# Patient Record
Sex: Male | Born: 1975 | Race: White | Hispanic: No | Marital: Married | Smoking: Current some day smoker
Health system: Southern US, Community
[De-identification: ages and names within clinical notes are randomized; demographics above are authoritative.]

## PROBLEM LIST (undated history)

## (undated) DIAGNOSIS — Z720 Tobacco use: Secondary | ICD-10-CM

## (undated) DIAGNOSIS — K219 Gastro-esophageal reflux disease without esophagitis: Secondary | ICD-10-CM

## (undated) DIAGNOSIS — R079 Chest pain, unspecified: Secondary | ICD-10-CM

## (undated) HISTORY — DX: Chest pain, unspecified: R07.9

## (undated) HISTORY — DX: Gastro-esophageal reflux disease without esophagitis: K21.9

## (undated) HISTORY — PX: HERNIA REPAIR: SHX51

## (undated) HISTORY — PX: OTHER SURGICAL HISTORY: SHX169

## (undated) HISTORY — DX: Tobacco use: Z72.0

---

## 2013-03-26 ENCOUNTER — Emergency Department (HOSPITAL_COMMUNITY)
Admission: EM | Admit: 2013-03-26 | Discharge: 2013-03-26 | Disposition: A | Discharge status: Home / Self Care | Payer: BC Managed Care – PPO | Attending: Emergency Medicine | Admitting: Emergency Medicine

## 2013-03-26 ENCOUNTER — Emergency Department (HOSPITAL_COMMUNITY): Payer: BC Managed Care – PPO

## 2013-03-26 ENCOUNTER — Encounter (HOSPITAL_COMMUNITY): Payer: Self-pay | Admitting: Emergency Medicine

## 2013-03-26 DIAGNOSIS — K219 Gastro-esophageal reflux disease without esophagitis: Secondary | ICD-10-CM | POA: Insufficient documentation

## 2013-03-26 DIAGNOSIS — I319 Disease of pericardium, unspecified: Secondary | ICD-10-CM | POA: Insufficient documentation

## 2013-03-26 DIAGNOSIS — F17200 Nicotine dependence, unspecified, uncomplicated: Secondary | ICD-10-CM | POA: Insufficient documentation

## 2013-03-26 LAB — CBC
HEMATOCRIT: 45.8 % (ref 39.0–52.0)
Hemoglobin: 15.9 g/dL (ref 13.0–17.0)
MCH: 30.6 pg (ref 26.0–34.0)
MCHC: 34.7 g/dL (ref 30.0–36.0)
MCV: 88.2 fL (ref 78.0–100.0)
PLATELETS: 276 10*3/uL (ref 150–400)
RBC: 5.19 MIL/uL (ref 4.22–5.81)
RDW: 14 % (ref 11.5–15.5)
WBC: 15.1 10*3/uL — AB (ref 4.0–10.5)

## 2013-03-26 LAB — I-STAT CHEM 8, ED
BUN: 15 mg/dL (ref 6–23)
Calcium, Ion: 1.22 mmol/L (ref 1.12–1.23)
Chloride: 101 mEq/L (ref 96–112)
Creatinine, Ser: 1.3 mg/dL (ref 0.50–1.35)
GLUCOSE: 105 mg/dL — AB (ref 70–99)
HCT: 48 % (ref 39.0–52.0)
HEMOGLOBIN: 16.3 g/dL (ref 13.0–17.0)
Potassium: 3.7 mEq/L (ref 3.7–5.3)
Sodium: 140 mEq/L (ref 137–147)
TCO2: 27 mmol/L (ref 0–100)

## 2013-03-26 LAB — I-STAT TROPONIN, ED
Troponin i, poc: 0 ng/mL (ref 0.00–0.08)
Troponin i, poc: 0 ng/mL (ref 0.00–0.08)

## 2013-03-26 MED ORDER — NAPROXEN 500 MG PO TABS: 500.0000 mg | ORAL_TABLET | Freq: Two times a day (BID) | ORAL | Status: DC

## 2013-03-26 MED ORDER — GI COCKTAIL ~~LOC~~
30.0000 mL | Freq: Once | ORAL | Status: AC
  Administered 2013-03-26: 30 mL via ORAL
  Filled 2013-03-26: qty 30

## 2013-03-26 MED ORDER — KETOROLAC TROMETHAMINE 30 MG/ML IJ SOLN
30.0000 mg | Freq: Once | INTRAMUSCULAR | Status: AC
  Administered 2013-03-26: 30 mg via INTRAVENOUS
  Filled 2013-03-26: qty 1

## 2013-03-26 NOTE — ED Notes (Signed)
Phlebotomy at bedside.

## 2013-03-26 NOTE — Discharge Instructions (Signed)
Please call your doctor for a followup appointment within 24-48 hours. When you talk to your doctor please let them know that you were seen in the emergency department and have them acquire all of your records so that they can discuss the findings with you and formulate a treatment plan to fully care for your new and ongoing problems. ° °

## 2013-03-26 NOTE — ED Notes (Signed)
Per ems-- 12 lead shows elevation in v3. No cardiac hx. Pt reports non radiating mid-sternal sharp cp that started at 0000 that woke him from sleep. took prilosec and tums without relief. Wife went to put pt in car and pt started vomiting. They then called ems. ems administered 3 nitro without change, 324 asa. Pt also reports some sob. Denies diaphoresis.

## 2013-03-26 NOTE — ED Provider Notes (Signed)
CSN: [id number]     Arrival date & time 03/26/13  0125 History   None    Chief Complaint  Patient presents with  . Chest Pain     (Consider location/radiation/quality/duration/timing/severity/associated sxs/prior Treatment) HPI Comments: The patient arrives by ambulance. He is a 38 year old male with no significant past medical history other than occasional acid reflux and tobacco use. He states that the chest pain awoke him from sleep while he was sleeping in bed, it is midsternal, sharp and persisted, worse with lying down and worse with taking a deep breath. He denies any injuries, he states that it is worse with movement including rotating his body position in the bed. He smokes cigarettes but has no other risk factors for cardiac disease. He denies trauma, travel, immobilization or prior history of DVT and no leg swelling.  Patient is a 38 y.o. male presenting with chest pain. The history is provided by the patient.  Chest Pain   History reviewed. No pertinent past medical history. Past Surgical History  Procedure Laterality Date  . Tubes in ears      as child   No family history on file. History  Substance Use Topics  . Smoking status: Current Some Day Smoker  . Smokeless tobacco: Not on file  . Alcohol Use: No    Review of Systems  Cardiovascular: Positive for chest pain.  All other systems reviewed and are negative.      Allergies  Review of patient's allergies indicates no known allergies.  Home Medications   Current Outpatient Rx  Name  Route  Sig  Dispense  Refill  . ALPRAZolam (XANAX) 0.25 MG tablet   Oral   Take 0.25 mg by mouth 3 (three) times daily as needed for anxiety.         . calcium carbonate (TUMS - DOSED IN MG ELEMENTAL CALCIUM) 500 MG chewable tablet   Oral   Chew 1 tablet by mouth daily as needed for indigestion or heartburn.         . sertraline (ZOLOFT) 50 MG tablet   Oral   Take 50 mg by mouth at bedtime.         . naproxen  (NAPROSYN) 500 MG tablet   Oral   Take 1 tablet (500 mg total) by mouth 2 (two) times daily with a meal.   30 tablet   0    BP 99/53  Pulse 62  Temp(Src) 97.6 F (36.4 C)  Resp 19  SpO2 97% Physical Exam  Nursing note and vitals reviewed. Constitutional: He appears well-developed and well-nourished. No distress.  HENT:  Head: Normocephalic and atraumatic.  Mouth/Throat: Oropharynx is clear and moist. No oropharyngeal exudate.  Eyes: Conjunctivae and EOM are normal. Pupils are equal, round, and reactive to light. Right eye exhibits no discharge. Left eye exhibits no discharge. No scleral icterus.  Neck: Normal range of motion. Neck supple. No JVD present. No thyromegaly present.  Cardiovascular: Normal rate, regular rhythm, normal heart sounds and intact distal pulses.  Exam reveals no gallop and no friction rub.   No murmur heard. Pulmonary/Chest: Effort normal and breath sounds normal. No respiratory distress. He has no wheezes. He has no rales. He exhibits no tenderness.  Abdominal: Soft. Bowel sounds are normal. He exhibits no distension and no mass. There is no tenderness.  Musculoskeletal: Normal range of motion. He exhibits no edema and no tenderness.  Lymphadenopathy:    He has no cervical adenopathy.  Neurological: He is alert. Coordination  normal.  Skin: Skin is warm and dry. No rash noted. No erythema.  Psychiatric: He has a normal mood and affect. His behavior is normal.    ED Course  Procedures (including critical care time) Labs Review Labs Reviewed  CBC - Abnormal; Notable for the following:    WBC 15.1 (*)    All other components within normal limits  I-STAT CHEM 8, ED - Abnormal; Notable for the following:    Glucose, Bld 105 (*)    All other components within normal limits  I-STAT TROPOININ, ED  Rosezena Sensor, ED   Imaging Review Dg Chest 2 View  03/26/2013   CLINICAL DATA:  Chest pain, shortness of breath.  EXAM: CHEST  2 VIEW  COMPARISON:  None.   FINDINGS: The heart size and mediastinal contours are within normal limits. Both lungs are clear. No pneumothorax or pleural effusion is noted. The visualized skeletal structures are unremarkable.  IMPRESSION: No acute cardiopulmonary abnormality seen.   Electronically Signed   By: Roque Lias M.D.   On: 03/26/2013 03:00     EKG Interpretation   Date/Time:  Tuesday March 26 2013 02:58:11 EDT Ventricular Rate:  66 PR Interval:  195 QRS Duration: 77 QT Interval:  366 QTC Calculation: 383 R Axis:   70 Text Interpretation:  Normal sinus rhythm ST elevation in diffuse leads,  pericarditis since last tracing no significant change Abnormal ekg  Confirmed by Lyndia Bury  MD, Laquanda Bick ([id number]) on 03/26/2013 4:56:33 AM      MDM   Final diagnoses:  Pericarditis    The patient has a benign exam, his laboratory workup thus far is unremarkable including a normal troponin, normal basic metabolic panel though he does have a slight leukocytosis. His EKG shows diffuse ST elevations, no signs of her several changes. Troponin is normal.  2 neg troponins, ECG without change, d/w Dr. Tresa Endo on call for cardiology who has seen ECG and agreeable that as pt has no changes, better with nsaids, low risk ACS can f/u with cardiology as outpt.  Pt informed, he is comfortable, no signs of tamponade.  Meds given in ED:  Medications  ketorolac (TORADOL) 30 MG/ML injection 30 mg (30 mg Intravenous Given 03/26/13 0156)  gi cocktail (Maalox,Lidocaine,Donnatal) (30 mLs Oral Given 03/26/13 0156)    New Prescriptions   NAPROXEN (NAPROSYN) 500 MG TABLET    Take 1 tablet (500 mg total) by mouth 2 (two) times daily with a meal.        Vida Roller, MD 03/26/13 [phone]

## 2013-03-27 ENCOUNTER — Encounter: Payer: Self-pay | Admitting: Cardiology

## 2013-03-27 ENCOUNTER — Ambulatory Visit (INDEPENDENT_AMBULATORY_CARE_PROVIDER_SITE_OTHER): Payer: BC Managed Care – PPO | Admitting: Cardiology

## 2013-03-27 ENCOUNTER — Other Ambulatory Visit: Payer: Self-pay

## 2013-03-27 VITALS — BP 132/88 | Ht 74.0 in | Wt 207.0 lb

## 2013-03-27 DIAGNOSIS — I309 Acute pericarditis, unspecified: Secondary | ICD-10-CM | POA: Insufficient documentation

## 2013-03-27 LAB — SEDIMENTATION RATE: Sed Rate: 2 mm/hr (ref 0–22)

## 2013-03-27 LAB — C-REACTIVE PROTEIN: CRP: 0.7 mg/dL (ref 0.5–20.0)

## 2013-03-27 MED ORDER — COLCHICINE 0.6 MG PO TABS: 0.6000 mg | ORAL_TABLET | Freq: Every day | ORAL | Status: DC

## 2013-03-27 MED ORDER — INDOMETHACIN 50 MG PO CAPS: 50.0000 mg | ORAL_CAPSULE | Freq: Two times a day (BID) | ORAL | Status: DC

## 2013-03-27 MED ORDER — LANSOPRAZOLE 15 MG PO CPDR: 15.0000 mg | DELAYED_RELEASE_CAPSULE | Freq: Every day | ORAL | Status: DC

## 2013-03-27 MED ORDER — COLCHICINE 0.6 MG PO TABS: 0.6000 mg | ORAL_TABLET | Freq: Two times a day (BID) | ORAL | Status: DC

## 2013-03-27 NOTE — Addendum Note (Signed)
Addended by: Kem ParkinsonBARNES, Araiya Tilmon on: 03/27/2013 12:13 PM   Modules accepted: Orders

## 2013-03-27 NOTE — Patient Instructions (Signed)
Your physician has requested that you have an echocardiogram. Echocardiography is a painless test that uses sound waves to create images of your heart. It provides your doctor with information about the size and shape of your heart and how well your heart's chambers and valves are working. This procedure takes approximately one hour. There are no restrictions for this procedure.   Lab work today ( sed rate,crp )   Start Indomethacin 50 mg three times a day with food. Start Colchicine 0.6 mg twice a day Start Prevacid 15 mg daily.   Your physician recommends that you schedule a follow-up appointment after Echo

## 2013-03-27 NOTE — Progress Notes (Signed)
Patient ID: Shawn Wall Luna, male   DOB: [birth year], 38 y.o.   MRN: [id number]    Patient Name: Shawn Wall Yorio Date of Encounter: 03/27/2013  Primary Care Provider:  Default, Provider, MD Primary Cardiologist:  Lars MassonNELSON, Corey Caulfield H  Problem List   Past Medical History  Diagnosis Date  . Chest pain   . Acid reflux     Occasional  . Tobacco use    Past Surgical History  Procedure Laterality Date  . Tubes in ears      as child   Allergies  No Known Allergies  HPI  38 year old gentleman with no prior medical history who presented to the ER on 03/24/2013 with pleuritic type of chest pain. He was ruled out for ACS with negative troponins. His EKG shows diffuse mild ST elevation consistent with acute pericarditis. The patient was started on Naprosyn but so far gave him no relief. He states that 2 weeks prior to this episode he had a tooth removed this was followed by taking amoxicillin for 10 days. He denies any fevers but describes chills and night sweats. His white count in the ER was 15,000. He is otherwise fairly healthy denies exertional shortness of breath or chest pain palpitations or syncope. He has no dizziness, orthopnea, or lower extremity edema.  Home Medications  Prior to Admission medications   Medication Sig Start Date End Date Taking? Authorizing Provider  HYDROcodone-acetaminophen (NORCO/VICODIN) 5-325 MG per tablet as needed. 02/27/13  Yes Historical Provider, MD  ibuprofen (ADVIL,MOTRIN) 800 MG tablet as needed. 02/27/13  Yes Historical Provider, MD  ALPRAZolam Prudy Feeler(XANAX) 0.25 MG tablet Take 0.25 mg by mouth 3 (three) times daily as needed for anxiety.    Historical Provider, MD  calcium carbonate (TUMS - DOSED IN MG ELEMENTAL CALCIUM) 500 MG chewable tablet Chew 1 tablet by mouth daily as needed for indigestion or heartburn.    Historical Provider, MD  naproxen (NAPROSYN) 500 MG tablet Take 1 tablet (500 mg total) by mouth 2 (two) times daily with a meal. 03/26/13   Vida RollerBrian D  Miller, MD  sertraline (ZOLOFT) 50 MG tablet Take 50 mg by mouth at bedtime.    Historical Provider, MD    Family History  Family History  Problem Relation Age of Onset  . Heart attack    . Stroke    . Hypertension    . Diabetes    . Heart failure      Social History  History   Social History  . Marital Status: Married    Spouse Name: N/A    Number of Children: N/A  . Years of Education: N/A   Occupational History  . Not on file.   Social History Main Topics  . Smoking status: Current Some Day Smoker  . Smokeless tobacco: Never Used  . Alcohol Use: No  . Drug Use: No  . Sexual Activity: Not on file   Other Topics Concern  . Not on file   Social History Narrative  . No narrative on file     Review of Systems, as per HPI, otherwise negative General:  No chills, fever, night sweats or weight changes.  Cardiovascular:  No chest pain, dyspnea on exertion, edema, orthopnea, palpitations, paroxysmal nocturnal dyspnea. Dermatological: No rash, lesions/masses Respiratory: No cough, dyspnea Urologic: No hematuria, dysuria Abdominal:   No nausea, vomiting, diarrhea, bright red blood per rectum, melena, or hematemesis Neurologic:  No visual changes, wkns, changes in mental status. All other systems reviewed and are otherwise negative except as  noted above.  Physical Exam  Blood pressure 132/88, height 6\' 2"  (1.88 m), weight 207 lb (93.895 kg).  General: Pleasant, NAD Psych: Normal affect. Neuro: Alert and oriented X 3. Moves all extremities spontaneously. HEENT: Normal  Neck: Supple without bruits or JVD. Lungs:  Resp regular and unlabored, CTA. Heart: RRR no s3, s4, or murmurs. Abdomen: Soft, non-tender, non-distended, BS + x 4.  Extremities: No clubbing, cyanosis or edema. DP/PT/Radials 2+ and equal bilaterally.  Labs:  No results found for this basename: CKTOTAL, CKMB, TROPONINI,  in the last 72 hours Lab Results  Component Value Date   WBC 15.1* 03/26/2013    HGB 16.3 03/26/2013   HCT 48.0 03/26/2013   MCV 88.2 03/26/2013   PLT 276 03/26/2013    Recent Labs Lab 03/26/13 0208  NA 140  K 3.7  CL 101  BUN 15  CREATININE 1.30  GLUCOSE 105*   Accessory Clinical Findings  Echocardiogram - none  ECG - sinus rhythm, ST elevation in diffuse leads consistent with acute pericarditis.   Assessment & Plan  38 year old gentleman with newly diagnosed acute pericarditis. We will start him on colchicine 0.6 mg twice a day and indomethacin 50 milligrams 3 times a day. We will check sedimentation rate and CRP and order an echocardiogram.  We will follow in 3 weeks.  Lars Masson, MD, [facility] 03/27/2013, 11:40 AM

## 2013-03-28 ENCOUNTER — Other Ambulatory Visit: Payer: Self-pay

## 2013-03-28 ENCOUNTER — Ambulatory Visit (HOSPITAL_COMMUNITY): Payer: BC Managed Care – PPO | Attending: Cardiovascular Disease | Admitting: Radiology

## 2013-03-28 ENCOUNTER — Encounter: Payer: Self-pay | Admitting: Cardiovascular Disease

## 2013-03-28 DIAGNOSIS — I309 Acute pericarditis, unspecified: Secondary | ICD-10-CM | POA: Insufficient documentation

## 2013-03-28 NOTE — Progress Notes (Signed)
Echocardiogram performed.  

## 2013-04-26 ENCOUNTER — Ambulatory Visit: Payer: BC Managed Care – PPO | Admitting: Cardiology

## 2013-05-10 ENCOUNTER — Encounter: Payer: Self-pay | Admitting: Cardiology

## 2013-05-28 ENCOUNTER — Ambulatory Visit: Payer: BC Managed Care – PPO | Admitting: Cardiology

## 2013-06-27 ENCOUNTER — Encounter: Payer: Self-pay | Admitting: Cardiology

## 2014-10-31 ENCOUNTER — Other Ambulatory Visit: Payer: Self-pay | Admitting: Physician Assistant

## 2014-10-31 DIAGNOSIS — R1013 Epigastric pain: Secondary | ICD-10-CM

## 2014-10-31 DIAGNOSIS — R1011 Right upper quadrant pain: Secondary | ICD-10-CM

## 2014-11-03 ENCOUNTER — Ambulatory Visit
Admission: RE | Admit: 2014-11-03 | Discharge: 2014-11-03 | Disposition: A | Discharge status: Home / Self Care | Payer: BLUE CROSS/BLUE SHIELD | Source: Ambulatory Visit | Attending: Physician Assistant | Admitting: Physician Assistant

## 2014-11-03 DIAGNOSIS — R1013 Epigastric pain: Secondary | ICD-10-CM

## 2014-11-03 DIAGNOSIS — R1011 Right upper quadrant pain: Secondary | ICD-10-CM

## 2015-10-05 IMAGING — CR DG CHEST 2V
2 series · 2 of 2 positions shown · non-contrast
Comparison: None.

CLINICAL DATA: Chest pain, shortness of breath.

EXAM:
CHEST  2 VIEW

[w chest pa]
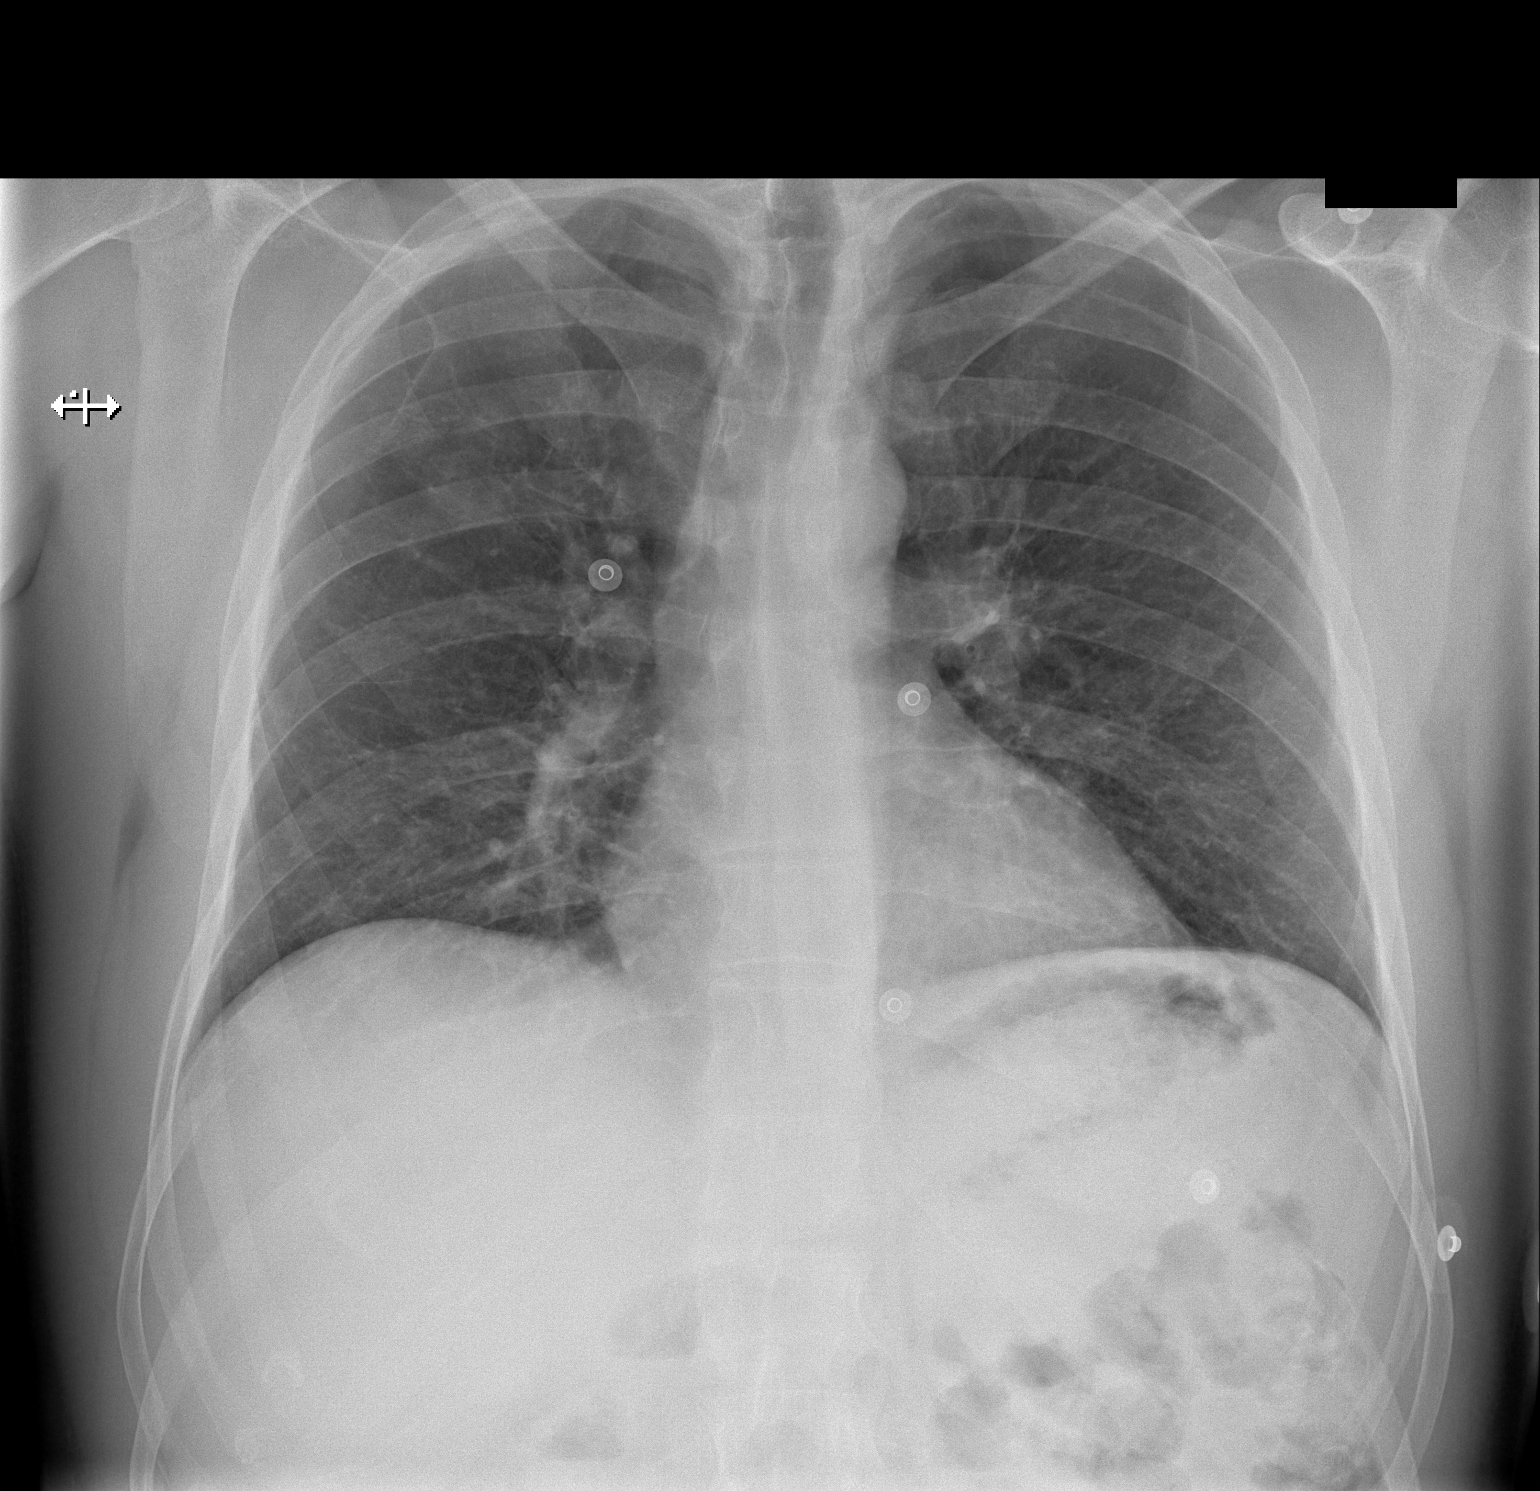

[w chest lat]
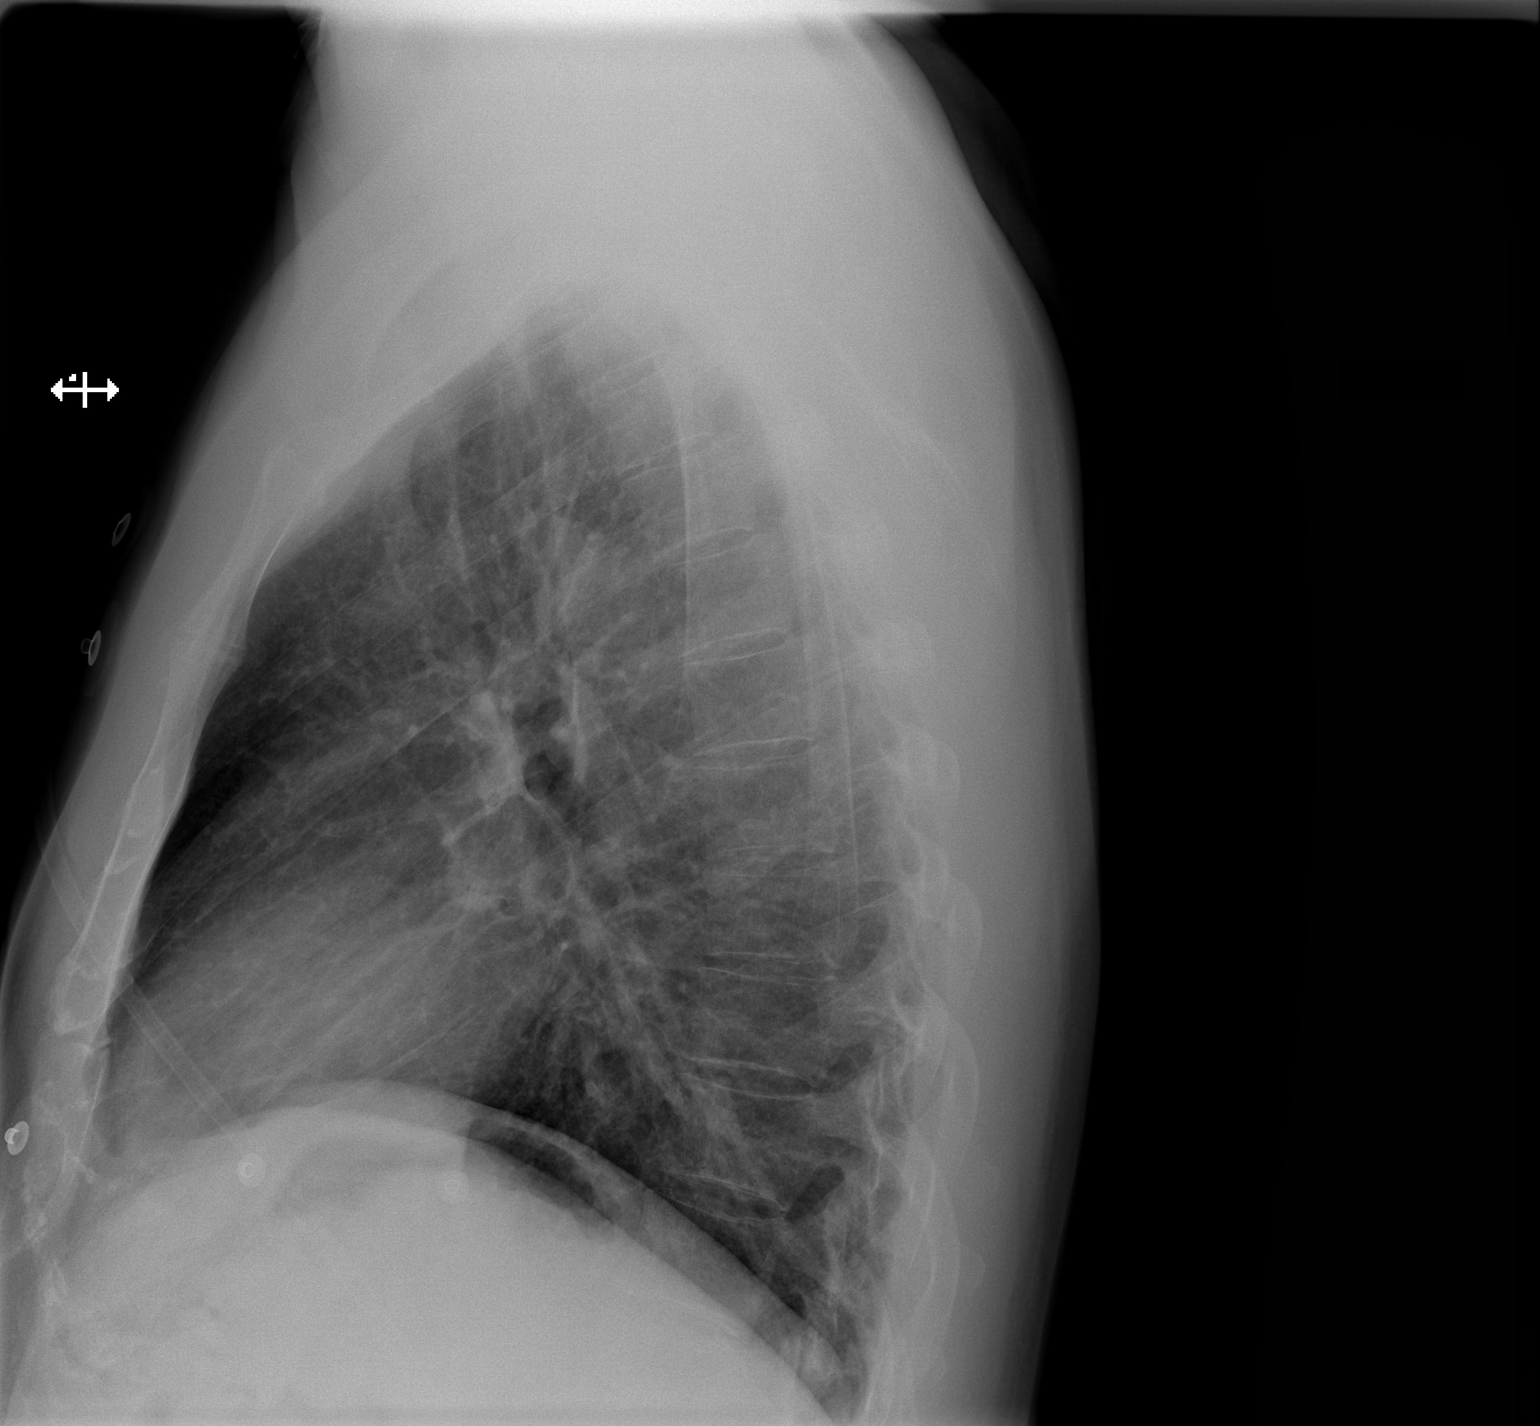

[2 of 2 positions shown; findings below may reference images not displayed]

FINDINGS: The heart size and mediastinal contours are within normal limits.
Both lungs are clear. No pneumothorax or pleural effusion is noted.
The visualized skeletal structures are unremarkable.
IMPRESSION: No acute cardiopulmonary abnormality seen.

## 2017-05-14 IMAGING — US US ABDOMEN COMPLETE
1 series · 14 of 25 positions shown · non-contrast
Comparison: CT 10/24/2006

CLINICAL DATA: Right upper quadrant pain, epigastric pain.

EXAM:
ULTRASOUND ABDOMEN COMPLETE

[Series 1: us abdomen complete · 0.22mm/px · 14 of 81 slices shown]
[im 1/81]
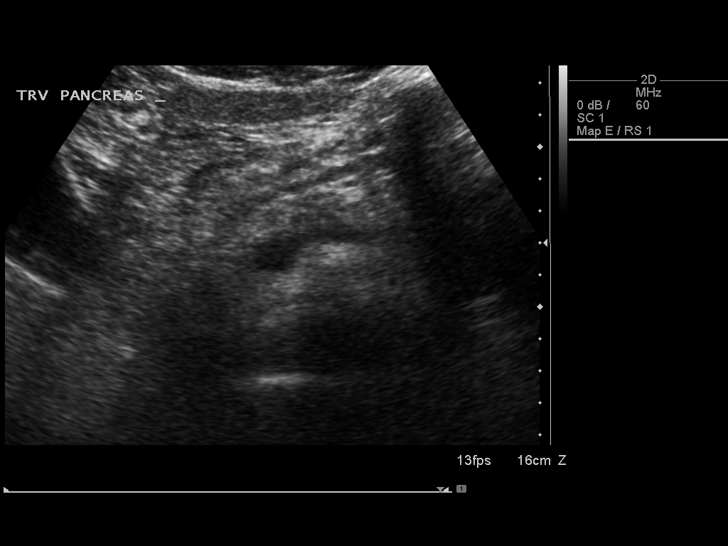
[im 7/81]
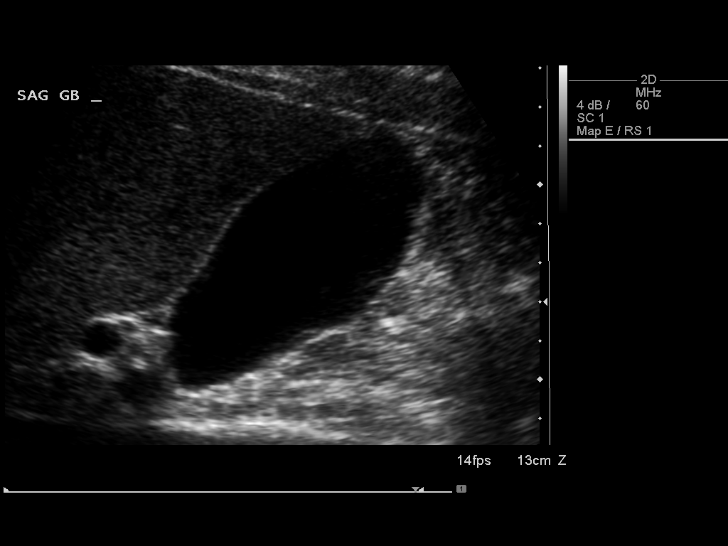
[im 14/81]
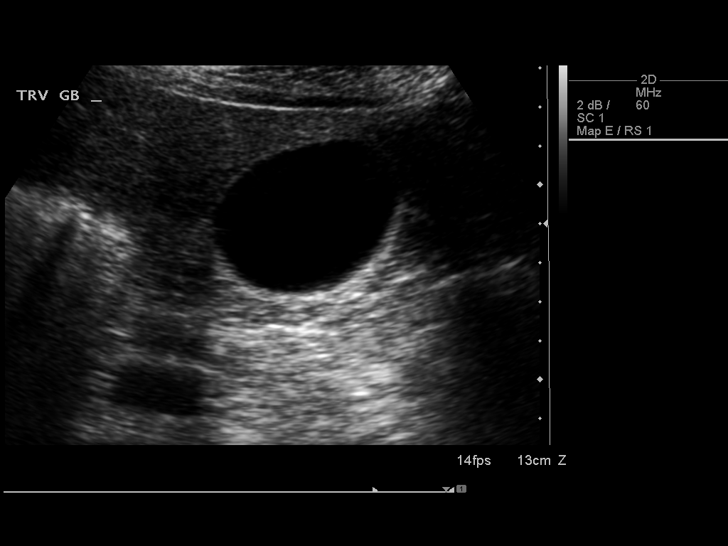
[im 21/81]
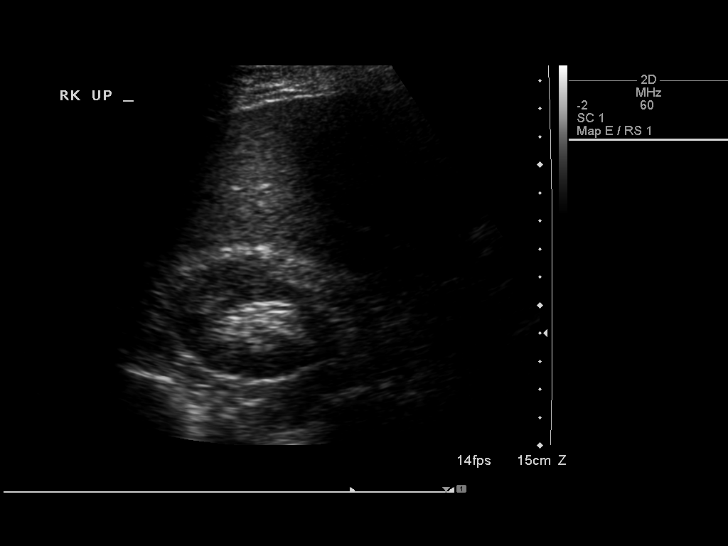
[im 27/81]
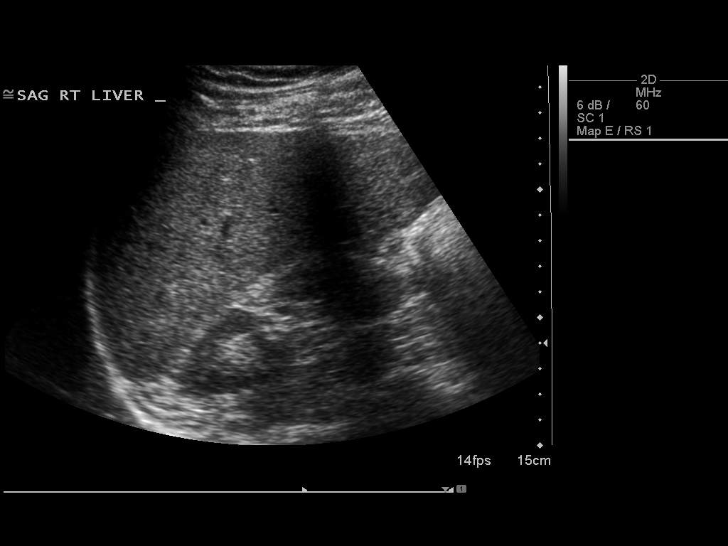
[im 31/81]
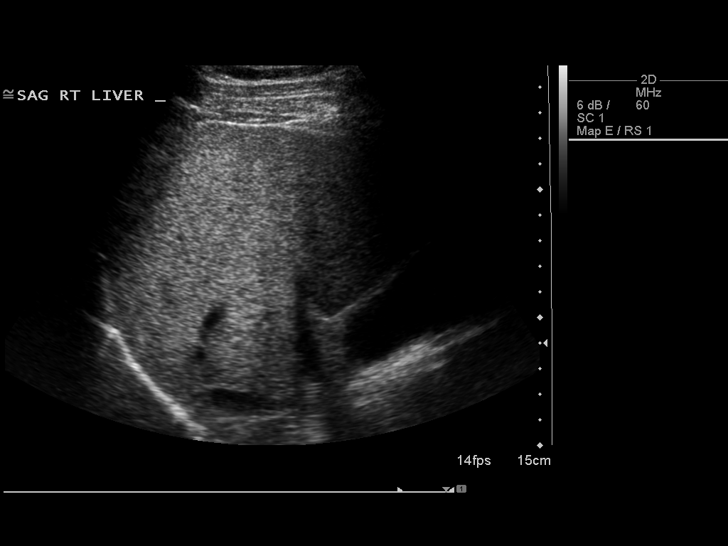
[im 37/81]
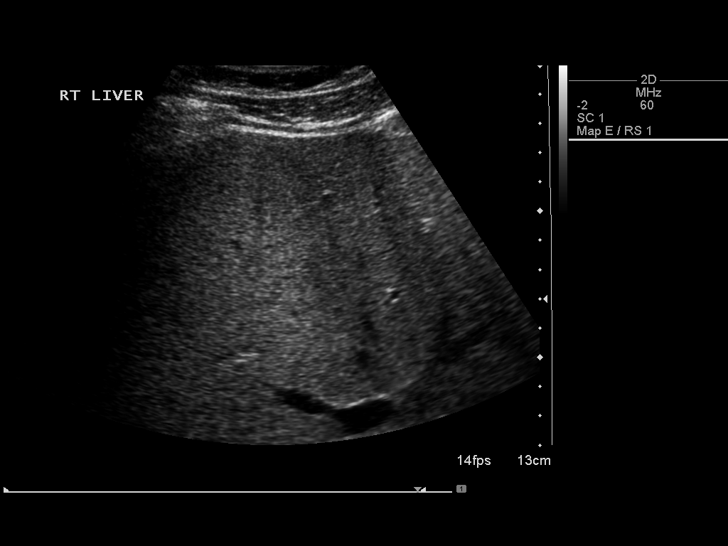
[im 44/81]
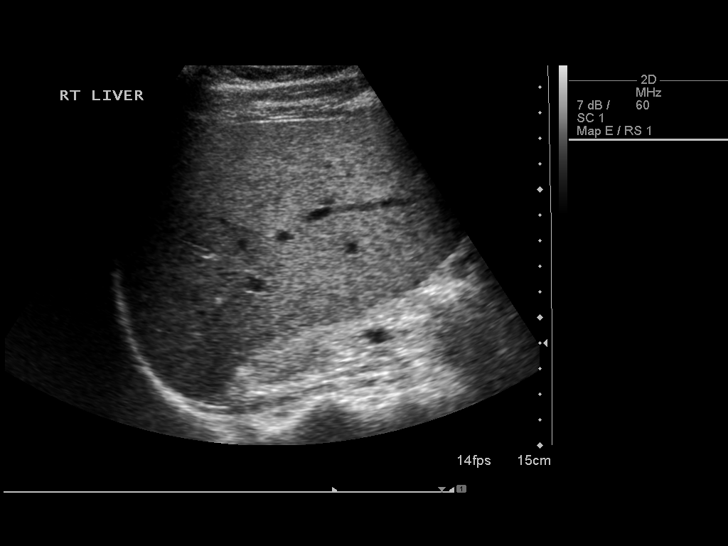
[im 51/81]
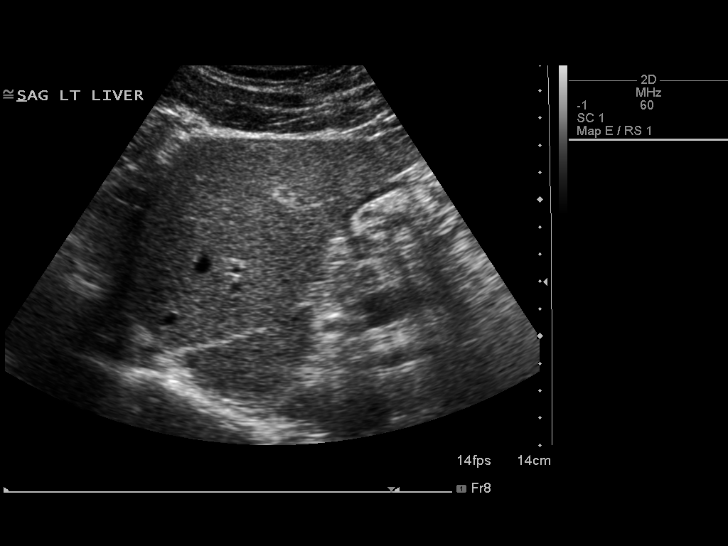
[im 54/81]
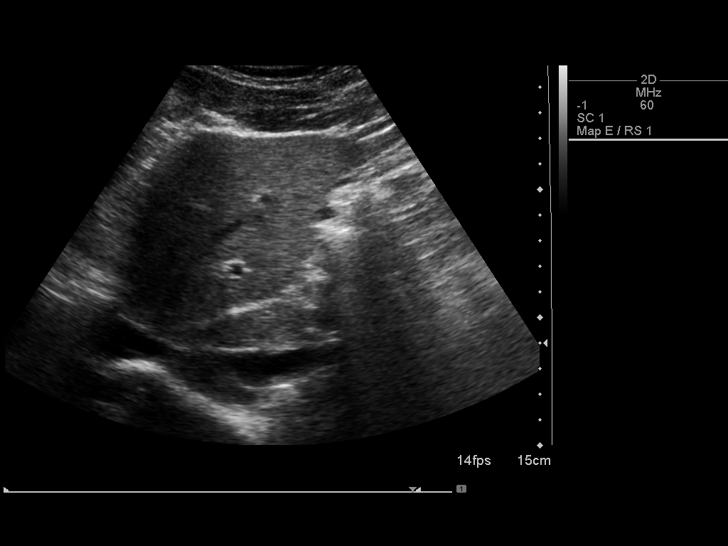
[im 61/81]
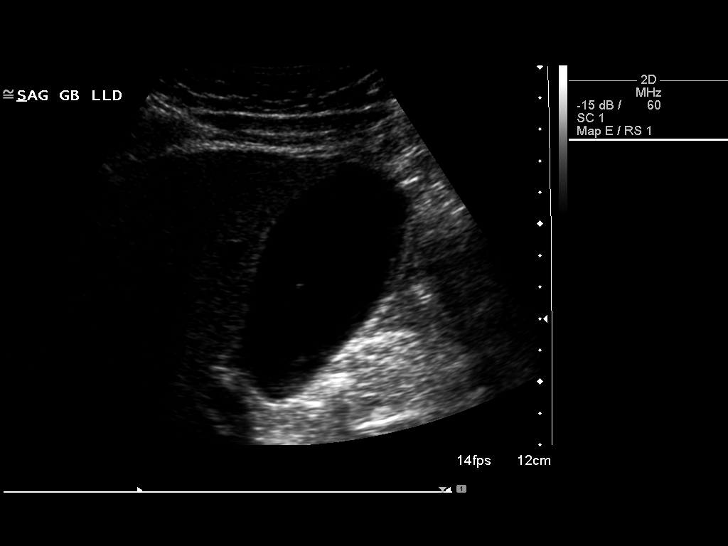
[im 67/81]
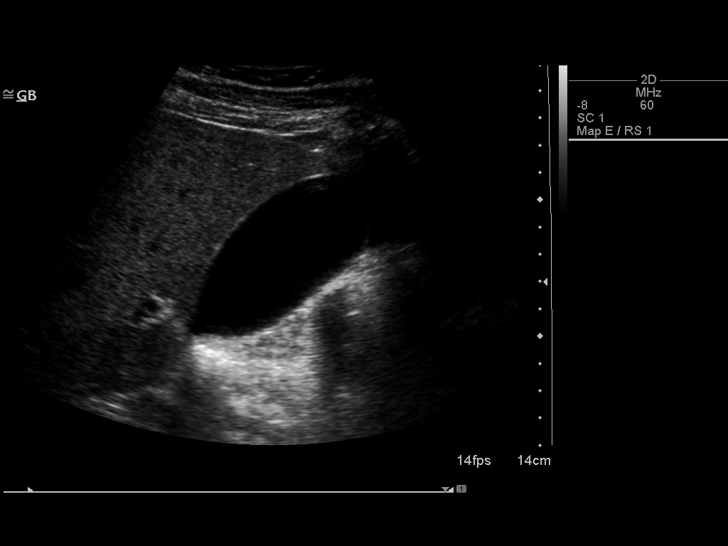
[im 74/81]
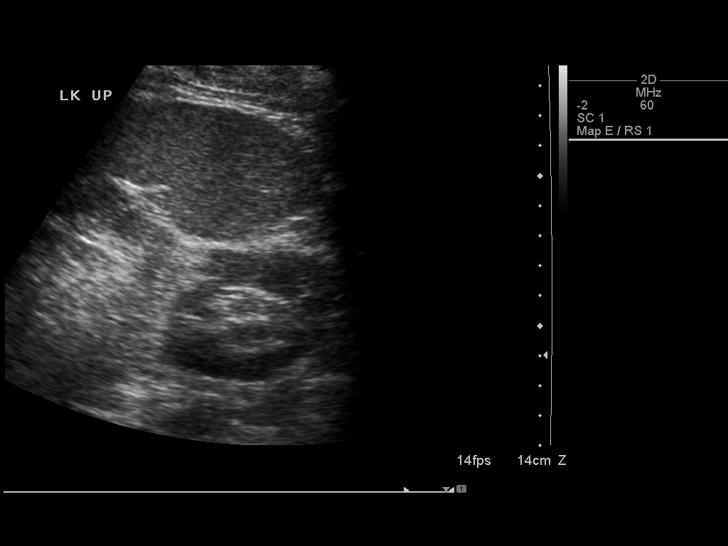
[im 81/81]
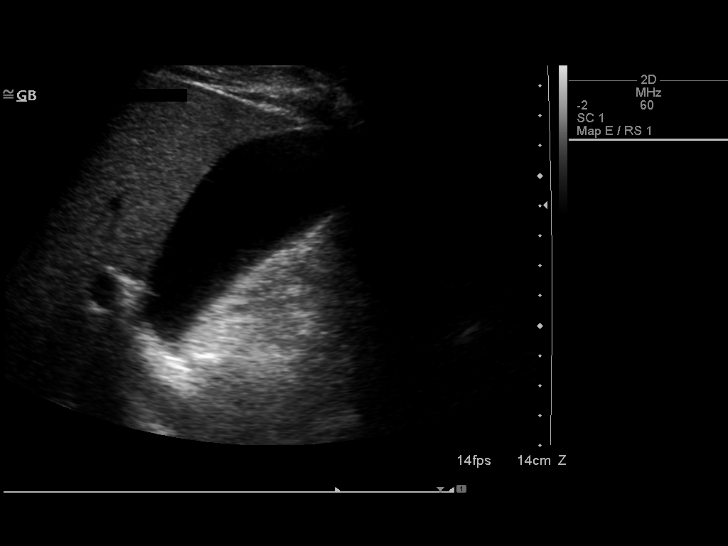

[14 of 25 positions shown; findings below may reference images not displayed]

FINDINGS: Gallbladder: No gallstones or wall thickening visualized. No
sonographic Murphy sign noted.

Common bile duct: Diameter: Normal caliber, 5 mm distally

Liver: Scattered areas of heterogeneity and increased echotexture
throughout the liver, likely geographic fatty infiltration. Probable
small cyst in the anterior right hepatic dome containing
calcification along the posterior wall, 6 mm maximally.

IVC: No abnormality visualized.

Pancreas: Visualized portion unremarkable.

Spleen: Size and appearance within normal limits.

Right Kidney: Length: 12.5 cm. Echogenicity within normal limits. No
mass or hydronephrosis visualized.

Left Kidney: Length: 12.0 cm. Echogenicity within normal limits. No
mass or hydronephrosis visualized.

Abdominal aorta: No aneurysm visualized.

Other findings: None.
IMPRESSION: Geographic fatty infiltration of the liver.

No acute findings.

## 2018-09-12 ENCOUNTER — Encounter (HOSPITAL_COMMUNITY): Payer: Self-pay | Admitting: Emergency Medicine

## 2018-09-12 ENCOUNTER — Emergency Department (HOSPITAL_COMMUNITY)
Admission: EM | Admit: 2018-09-12 | Discharge: 2018-09-12 | Disposition: A | Discharge status: Home / Self Care | Payer: PRIVATE HEALTH INSURANCE | Attending: Emergency Medicine | Admitting: Emergency Medicine

## 2018-09-12 ENCOUNTER — Emergency Department (HOSPITAL_COMMUNITY): Payer: PRIVATE HEALTH INSURANCE

## 2018-09-12 DIAGNOSIS — I309 Acute pericarditis, unspecified: Secondary | ICD-10-CM | POA: Diagnosis not present

## 2018-09-12 DIAGNOSIS — Z79899 Other long term (current) drug therapy: Secondary | ICD-10-CM | POA: Diagnosis not present

## 2018-09-12 DIAGNOSIS — R05 Cough: Secondary | ICD-10-CM | POA: Insufficient documentation

## 2018-09-12 DIAGNOSIS — F17210 Nicotine dependence, cigarettes, uncomplicated: Secondary | ICD-10-CM | POA: Insufficient documentation

## 2018-09-12 DIAGNOSIS — R0789 Other chest pain: Secondary | ICD-10-CM | POA: Diagnosis present

## 2018-09-12 LAB — BASIC METABOLIC PANEL
Anion gap: 11 (ref 5–15)
BUN: 26 mg/dL — ABNORMAL HIGH (ref 6–20)
CO2: 27 mmol/L (ref 22–32)
Calcium: 10.1 mg/dL (ref 8.9–10.3)
Chloride: 98 mmol/L (ref 98–111)
Creatinine, Ser: 1.19 mg/dL (ref 0.61–1.24)
GFR calc Af Amer: >60 mL/min (ref >60)
GFR calc non Af Amer: >60 mL/min (ref >60)
Glucose, Bld: 133 mg/dL — ABNORMAL HIGH (ref 70–99)
Potassium: 4.8 mmol/L (ref 3.5–5.1)
Sodium: 136 mmol/L (ref 135–145)

## 2018-09-12 LAB — TROPONIN I (HIGH SENSITIVITY)
Troponin I (High Sensitivity): 5 ng/L (ref <18)
Troponin I (High Sensitivity): 4 ng/L (ref <18)

## 2018-09-12 LAB — D-DIMER, QUANTITATIVE: D-Dimer, Quant: 0.31 ug/mL-FEU (ref 0.00–0.50)

## 2018-09-12 LAB — CBC
HCT: 52.5 % — ABNORMAL HIGH (ref 39.0–52.0)
Hemoglobin: 17.3 g/dL — ABNORMAL HIGH (ref 13.0–17.0)
MCH: 29.8 pg (ref 26.0–34.0)
MCHC: 33 g/dL (ref 30.0–36.0)
MCV: 90.5 fL (ref 80.0–100.0)
Platelets: 292 10*3/uL (ref 150–400)
RBC: 5.8 MIL/uL (ref 4.22–5.81)
RDW: 13 % (ref 11.5–15.5)
WBC: 13 10*3/uL — ABNORMAL HIGH (ref 4.0–10.5)
nRBC: 0 % (ref 0.0–0.2)

## 2018-09-12 MED ORDER — INDOMETHACIN 50 MG PO CAPS: 50.0000 mg | ORAL_CAPSULE | Freq: Two times a day (BID) | ORAL | 6 refills | Status: DC

## 2018-09-12 MED ORDER — SODIUM CHLORIDE 0.9% FLUSH: 3.0000 mL | Freq: Once | INTRAVENOUS | Status: DC

## 2018-09-12 MED ORDER — COLCHICINE 0.6 MG PO TABS: ORAL_TABLET | ORAL | 0 refills | Status: DC

## 2018-09-12 MED ORDER — KETOROLAC TROMETHAMINE 60 MG/2ML IM SOLN
60.0000 mg | Freq: Once | INTRAMUSCULAR | Status: AC
  Administered 2018-09-12: 60 mg via INTRAMUSCULAR
  Filled 2018-09-12: qty 2

## 2018-09-12 NOTE — ED Triage Notes (Signed)
Pt had an episode of cp that woke him up from his sleep 2 nights ago that was 10/10 and lasted about 30 mins - 1 hour. Pt states since this episode he has just felt fatigued and "not well". Pt denies any CP. Pt was seen at an eye surgery center today and they advised him to come to the ER.Shawn Wall

## 2018-09-12 NOTE — ED Provider Notes (Signed)
[facility]   CSN: [id number] Arrival date & time: 09/12/18  1124     History   Chief Complaint Chief Complaint  Patient presents with  . Chest Pain    HPI Shawn BleakJonathan Wall is a 43 y.o. male.     HPI Patient presents with sharp left-sided chest pain that woke him from sleep 2 nights ago.  States the pain has been constant since.  Worse with movement but improved when leaning forward.  Denies any shortness of breath.  States he has a chronic cough from smoking.  No new lower extremity swelling or pain.  No recent extended travel or immobilization.  No fever or chills. Past Medical History:  Diagnosis Date  . Acid reflux    Occasional  . Chest pain   . Tobacco use     Patient Active Problem List   Diagnosis Date Noted  . Acute pericarditis 03/27/2013    Past Surgical History:  Procedure Laterality Date  . tubes in ears     as child        Home Medications    Prior to Admission medications   Medication Sig Start Date End Date Taking? Authorizing Provider  atenolol (TENORMIN) 25 MG tablet Take 25 mg by mouth daily.   Yes [provider]  calcium carbonate (TUMS - DOSED IN MG ELEMENTAL CALCIUM) 500 MG chewable tablet Chew 1 tablet by mouth daily as needed for indigestion or heartburn.    [provider]  colchicine 0.6 MG tablet Take 2 tabs every 12 hours x1 day, then take 1 tab every 12 hours. 09/12/18   Loren RacerYelverton, Khylin Gutridge, MD  indomethacin (INDOCIN) 50 MG capsule Take 1 capsule (50 mg total) by mouth 2 (two) times daily with a meal. 09/12/18   Loren RacerYelverton, Frieda Arnall, MD  lansoprazole (PREVACID) 15 MG capsule Take 1 capsule (15 mg total) by mouth daily at 12 noon. Patient not taking: Reported on 09/12/2018 03/27/13   Lars MassonNelson, Katarina H, MD  naproxen (NAPROSYN) 500 MG tablet Take 1 tablet (500 mg total) by mouth 2 (two) times daily with a meal. Patient not taking: Reported on 09/12/2018 03/26/13   Eber HongMiller, Brian,  MD  sertraline (ZOLOFT) 50 MG tablet Take 50 mg by mouth at bedtime.    [provider]    Family History Family History  Problem Relation Age of Onset  . Heart attack Other   . Stroke Other   . Hypertension Other   . Diabetes Other   . Heart failure Other     Social History Social History   Tobacco Use  . Smoking status: Current Some Day Smoker    Packs/day: 1.00  . Smokeless tobacco: Never Used  Substance Use Topics  . Alcohol use: No  . Drug use: No     Allergies   Patient has no known allergies.   Review of Systems Review of Systems  Constitutional: Negative for chills and fever.  HENT: Negative for sore throat and trouble swallowing.   Eyes: Negative for visual disturbance.  Respiratory: Positive for cough. Negative for shortness of breath.   Cardiovascular: Positive for chest pain. Negative for palpitations and leg swelling.  Gastrointestinal: Negative for abdominal pain, constipation, diarrhea, nausea and vomiting.  Genitourinary: Negative for dysuria, flank pain and frequency.  Musculoskeletal: Negative for back pain and neck pain.  Skin: Negative for rash and wound.  Neurological: Negative for dizziness, weakness, light-headedness, numbness and headaches.  All other systems reviewed and are  negative.    Physical Exam Updated Vital Signs BP 112/81   Pulse 60   Temp 98 F (36.7 C) (Oral)   Resp 16   SpO2 94%   Physical Exam Vitals signs and nursing note reviewed.  Constitutional:      Appearance: Normal appearance. He is well-developed.  HENT:     Head: Normocephalic and atraumatic.     Nose: Nose normal.     Mouth/Throat:     Mouth: Mucous membranes are moist.  Eyes:     Extraocular Movements: Extraocular movements intact.     Pupils: Pupils are equal, round, and reactive to light.  Neck:     Musculoskeletal: Normal range of motion and neck supple. No neck rigidity or muscular tenderness.     Vascular: No carotid bruit.   Cardiovascular:     Rate and Rhythm: Normal rate and regular rhythm.     Heart sounds: No murmur. No friction rub. No gallop.   Pulmonary:     Effort: Pulmonary effort is normal. No respiratory distress.     Breath sounds: Normal breath sounds. No stridor. No wheezing, rhonchi or rales.  Chest:     Chest wall: No tenderness.  Abdominal:     General: Bowel sounds are normal.     Palpations: Abdomen is soft.     Tenderness: There is no abdominal tenderness. There is no guarding or rebound.  Musculoskeletal: Normal range of motion.        General: No swelling, tenderness, deformity or signs of injury.     Right lower leg: No edema.     Left lower leg: No edema.     Comments: No lower extremity swelling, asymmetry or tenderness.  Lymphadenopathy:     Cervical: No cervical adenopathy.  Skin:    General: Skin is warm and dry.     Capillary Refill: Capillary refill takes less than 2 seconds.     Findings: No erythema or rash.  Neurological:     General: No focal deficit present.     Mental Status: He is alert and oriented to person, place, and time.  Psychiatric:        Mood and Affect: Mood normal.        Behavior: Behavior normal.      ED Treatments / Results  Labs (all labs ordered are listed, but only abnormal results are displayed) Labs Reviewed  BASIC METABOLIC PANEL - Abnormal; Notable for the following components:      Result Value   Glucose, Bld 133 (*)    BUN 26 (*)    All other components within normal limits  CBC - Abnormal; Notable for the following components:   WBC 13.0 (*)    Hemoglobin 17.3 (*)    HCT 52.5 (*)    All other components within normal limits  D-DIMER, QUANTITATIVE (NOT AT [facility])  TROPONIN I (HIGH SENSITIVITY)  TROPONIN I (HIGH SENSITIVITY)    EKG EKG Interpretation  Date/Time:  Wednesday September 12 2018 11:34:46 EDT Ventricular Rate:  72 PR Interval:  164 QRS Duration: 80 QT Interval:  372 QTC Calculation: 407 R Axis:   62 Text  Interpretation:  Normal sinus rhythm Early repolarization Normal ECG Confirmed by Julianne Rice [phone]) on 09/12/2018 12:31:38 PM   Radiology Dg Chest 2 View  Result Date: 09/12/2018 CLINICAL DATA:  Chest pain EXAM: CHEST - 2 VIEW COMPARISON:  03/26/2013 FINDINGS: The heart size and mediastinal contours are within normal limits. Both lungs are clear. The visualized  skeletal structures are unremarkable. IMPRESSION: No acute abnormality of the lungs. Electronically Signed   By: Lauralyn Primes M.D.   On: 09/12/2018 12:14    Procedures Procedures (including critical care time)  Medications Ordered in ED Medications  sodium chloride flush (NS) 0.9 % injection 3 mL (3 mLs Intravenous Not Given 09/12/18 1255)  ketorolac (TORADOL) injection 60 mg (60 mg Intramuscular Given 09/12/18 1254)     Initial Impression / Assessment and Plan / ED Course  I have reviewed the triage vital signs and the nursing notes.  Pertinent labs & imaging results that were available during my care of the patient were reviewed by me and considered in my medical decision making (see chart for details).       Mild elevation in white blood cell count.  EKG consistent with possible pericarditis.  D-dimer and troponin x2 are normal.  Patient states he is feeling much better after Toradol.  Will start on colchicine and indomethacin.  Advised to follow-up closely with cardiology.  Return precautions given.  Final Clinical Impressions(s) / ED Diagnoses   Final diagnoses:  Acute pericarditis, unspecified type    ED Discharge Orders         Ordered    indomethacin (INDOCIN) 50 MG capsule  2 times daily with meals     09/12/18 1508    colchicine 0.6 MG tablet     09/12/18 1508           Loren Racer, MD 09/12/18 1511

## 2018-09-12 NOTE — ED Notes (Signed)
Patient verbalizes understanding of discharge instructions. Opportunity for questioning and answers were provided. Armband removed by staff, pt discharged from ED ambulatory.   

## 2018-10-04 ENCOUNTER — Ambulatory Visit (INDEPENDENT_AMBULATORY_CARE_PROVIDER_SITE_OTHER): Payer: PRIVATE HEALTH INSURANCE | Admitting: Cardiology

## 2018-10-04 ENCOUNTER — Other Ambulatory Visit: Payer: Self-pay

## 2018-10-04 ENCOUNTER — Encounter: Payer: Self-pay | Admitting: Cardiology

## 2018-10-04 VITALS — BP 124/86 | HR 62 | Ht 74.0 in | Wt 201.0 lb

## 2018-10-04 DIAGNOSIS — I10 Essential (primary) hypertension: Secondary | ICD-10-CM | POA: Diagnosis not present

## 2018-10-04 DIAGNOSIS — I319 Disease of pericardium, unspecified: Secondary | ICD-10-CM

## 2018-10-04 NOTE — Patient Instructions (Signed)
Medication Instructions:  Your physician recommends that you continue on your current medications as directed. Please refer to the Current Medication list given to you today.  If you need a refill on your cardiac medications before your next appointment, please call your pharmacy.   Lab work: Your physician recommends that you return for lab work in:  Fasting Lipid  If you have labs (blood work) drawn today and your tests are completely normal, you will receive your results only by: Marland Kitchen MyChart Message (if you have MyChart) OR . A paper copy in the mail If you have any lab test that is abnormal or we need to change your treatment, we will call you to review the results.  Testing/Procedures: Your physician has requested that you have an echocardiogram. Echocardiography is a painless test that uses sound waves to create images of your heart. It provides your doctor with information about the size and shape of your heart and how well your heart's chambers and valves are working. This procedure takes approximately one hour. There are no restrictions for this procedure.    Follow-Up: At [facility], you and your health needs are our priority.  As part of our continuing mission to provide you with exceptional heart care, we have created designated Provider Care Teams.  These Care Teams include your primary Cardiologist (physician) and Advanced Practice Providers (APPs -  Physician Assistants and Nurse Practitioners) who all work together to provide you with the care you need, when you need it. You will need a follow up appointment in 2 months.  Any Other Special Instructions Will Be Listed Below (If Applicable).

## 2018-10-04 NOTE — Progress Notes (Addendum)
Cardiology Office Note:    Date:  10/04/2018   ID:  Shawn Wall, DOB [birth year], MRN [id number]  PCP:  Nonnie DoneSlatosky, John J., MD  Cardiologist:  No primary care provider on file.  Electrophysiologist:  None   Referring MD: No ref. provider found   The patient was recently admitted for acute pericarditis.  History of Present Illness:    Shawn Wall is a 43 y.o. male with a history of hypertension (on atenolol), GERD, recently diagnosed with Lichen planus presents to be evaluated post hospital discharge for acute pancreatitis.  He notes that this was his second incident of pericarditis.  Back in 2015 he had an episode of diffuse chest pain and once at the hospital he was told this was pericarditis.  Last month he started to experience left-sided chest pain that woke him up from his sleep.  He reported that it felt worse when he took a deep breath however he remembers being told leaning forward when this happened which he did and he felt better.  He denied any shortness of breath, nausea, vomiting.  He states the characteristics of the pain reminded him of his previous pericarditis.   Therefore he went to the ED to be evaluated.  Past Medical History:  Diagnosis Date  . Acid reflux    Occasional  . Chest pain   . Tobacco use     Past Surgical History:  Procedure Laterality Date  . HERNIA REPAIR    . tubes in ears     as child    Current Medications: Current Meds  Medication Sig  . atenolol (TENORMIN) 25 MG tablet Take 25 mg by mouth daily.  . calcium carbonate (TUMS - DOSED IN MG ELEMENTAL CALCIUM) 500 MG chewable tablet Chew 1 tablet by mouth daily as needed for indigestion or heartburn.  . colchicine 0.6 MG tablet Take 2 tabs every 12 hours x1 day, then take 1 tab every 12 hours.     Allergies:   Patient has no known allergies.   Social History   Socioeconomic History  . Marital status: Married    Spouse name: Not on file  . Number of children: Not on file  . Years  of education: Not on file  . Highest education level: Not on file  Occupational History  . Not on file  Social Needs  . Financial resource strain: Not on file  . Food insecurity    Worry: Not on file    Inability: Not on file  . Transportation needs    Medical: Not on file    Non-medical: Not on file  Tobacco Use  . Smoking status: Current Some Day Smoker    Packs/day: 1.00    Years: 20.00    Pack years: 20.00    Types: Cigarettes  . Smokeless tobacco: Current User    Types: Snuff  Substance and Sexual Activity  . Alcohol use: No  . Drug use: No  . Sexual activity: Not on file  Lifestyle  . Physical activity    Days per week: Not on file    Minutes per session: Not on file  . Stress: Not on file  Relationships  . Social Musicianconnections    Talks on phone: Not on file    Gets together: Not on file    Attends religious service: Not on file    Active member of club or organization: Not on file    Attends meetings of clubs or organizations: Not on file  Relationship status: Not on file  Other Topics Concern  . Not on file  Social History Narrative  . Not on file     Family History: The patient's family history includes Diabetes in his father and sister; Heart attack in his father; Heart failure in his maternal grandmother; Hypertension in his father; Stroke in his maternal grandmother.  ROS:    Review of Systems  Constitution: Negative for chills, decreased appetite, night sweats and weight loss.  HENT: Negative for ear discharge, nosebleeds and sore throat.   Eyes: Negative for blurred vision, double vision, redness and vision loss in right eye.  Cardiovascular: Negative for chest pain, dyspnea on exertion, near-syncope and palpitations.  Respiratory: Negative for cough, shortness of breath, snoring and wheezing.   Endocrine: Negative for cold intolerance and polydipsia.  Hematologic/Lymphatic: Negative for bleeding problem.  Skin: Negative for nail changes, poor  wound healing and skin cancer.  Musculoskeletal: Negative for arthritis and gout.  Gastrointestinal: Negative for bloating, bowel incontinence, dysphagia and flatus.  Genitourinary: Negative for bladder incontinence, frequency and incomplete emptying.  Neurological: Negative for aphonia, excessive daytime sleepiness and light-headedness.  Psychiatric/Behavioral: Negative for altered mental status, hallucinations, memory loss and suicidal ideas.  Allergic/Immunologic: Negative for environmental allergies, HIV exposure and persistent infections.      EKGs/Labs/Other Studies Reviewed:      EKG: The ekg ordered today demonstrates sinus rhythm, heart rate 62 bpm, with early repolarization.  Similar to EKG performed on 09/19/2018.  Recent Labs: 09/12/2018: BUN 26; Creatinine, Ser 1.19; Hemoglobin 17.3; Platelets 292; Potassium 4.8; Sodium 136  Recent Lipid Panel No results found for: CHOL, TRIG, HDL, CHOLHDL, VLDL, LDLCALC, LDLDIRECT  Physical Exam:    VS:  BP 124/86 (BP Location: Right Arm, Patient Position: Sitting, Cuff Size: Large)   Pulse 62   Ht 6\' 2"  (1.88 m)   Wt 201 lb (91.2 kg)   SpO2 97%   BMI 25.81 kg/m     Wt Readings from Last 3 Encounters:  10/04/18 201 lb (91.2 kg)  03/27/13 207 lb (93.9 kg)     GEN:  Well nourished, well developed in no acute distress HEENT: Normal NECK: No JVD; No carotid bruits LYMPHATICS: No lymphadenopathy CARDIAC: RRR, no murmurs, rubs, gallops RESPIRATORY:  Clear to auscultation without rales, wheezing or rhonchi  ABDOMEN: Soft, non-tender, non-distended EXTREMITIES: No cyanosis, no edema, no clubbing MUSCULOSKELETAL:  No edema; No deformity  SKIN: Warm and dry NEUROLOGIC:  Alert and oriented x 3, no focal PSYCHIATRIC:  Normal affect   ASSESSMENT:    1. Pericarditis, unspecified chronicity, unspecified type   2. Hypertension, unspecified type    PLAN:     1. He will remain on the colchicine for completion of 3 months which in  his case will be December 13, 2018.  He is aware of this.  He is currently not on indomethacin.  He reports that he has had some improvement in his chest pain since been on his medications. 2. Transthoracic echocardiogram will be performed.  He is a current smoker I have advised patient of smoking cessation.  He notes that he is tried many times to quit smoking and for now he is thinking about it but have not made a decision to quit.   He does have some diastolic hypertension (diastolic blood pressures in the 90s) however the patient reports that usually at home they are in low 80s and below.  He will take his blood pressure daily and at his next visit we  will discuss the possibility of adding a small dose of another antihypertensive to help improve his diastolic hypertension.   I also advised the patient that he should see a rheumatologist in the setting of his recently diagnosed autoimmune disease.  He is in agreement with the above plan.  Will follow-up in 2 months.    Medication Adjustments/Labs and Tests Ordered: Current medicines are reviewed at length with the patient today.  Concerns regarding medicines are outlined above.  Orders Placed This Encounter  Procedures  . Lipid Profile  . EKG 12-Lead  . ECHOCARDIOGRAM COMPLETE   No orders of the defined types were placed in this encounter.   Patient Instructions  Medication Instructions:  Your physician recommends that you continue on your current medications as directed. Please refer to the Current Medication list given to you today.  If you need a refill on your cardiac medications before your next appointment, please call your pharmacy.   Lab work: Your physician recommends that you return for lab work in:  Fasting Lipid  If you have labs (blood work) drawn today and your tests are completely normal, you will receive your results only by: Marland Kitchen MyChart Message (if you have MyChart) OR . A paper copy in the mail If you have any  lab test that is abnormal or we need to change your treatment, we will call you to review the results.  Testing/Procedures: Your physician has requested that you have an echocardiogram. Echocardiography is a painless test that uses sound waves to create images of your heart. It provides your doctor with information about the size and shape of your heart and how well your heart's chambers and valves are working. This procedure takes approximately one hour. There are no restrictions for this procedure.    Follow-Up: At [facility], you and your health needs are our priority.  As part of our continuing mission to provide you with exceptional heart care, we have created designated Provider Care Teams.  These Care Teams include your primary Cardiologist (physician) and Advanced Practice Providers (APPs -  Physician Assistants and Nurse Practitioners) who all work together to provide you with the care you need, when you need it. You will need a follow up appointment in 2 months.  Any Other Special Instructions Will Be Listed Below (If Applicable).       Rolly Pancake, DO  10/04/2018 9:52 AM    [facility]

## 2018-11-06 ENCOUNTER — Other Ambulatory Visit: Payer: PRIVATE HEALTH INSURANCE

## 2018-11-12 ENCOUNTER — Telehealth: Payer: Self-pay | Admitting: Cardiology

## 2018-11-12 ENCOUNTER — Other Ambulatory Visit: Payer: Self-pay | Admitting: *Deleted

## 2018-11-12 MED ORDER — COLCHICINE 0.6 MG PO TABS: 0.6000 mg | ORAL_TABLET | Freq: Two times a day (BID) | ORAL | 1 refills | Status: AC

## 2018-11-12 NOTE — Telephone Encounter (Signed)
Call colchicine to [facility]

## 2018-11-12 NOTE — Telephone Encounter (Signed)
Refill sent in to [facility]

## 2018-12-04 ENCOUNTER — Ambulatory Visit: Payer: PRIVATE HEALTH INSURANCE | Admitting: Cardiology

## 2021-03-23 IMAGING — CR CHEST - 2 VIEW
2 series · 2 of 2 positions shown · non-contrast
Comparison: 03/26/2013

CLINICAL DATA: Chest pain

EXAM:
CHEST - 2 VIEW

[chest pa]
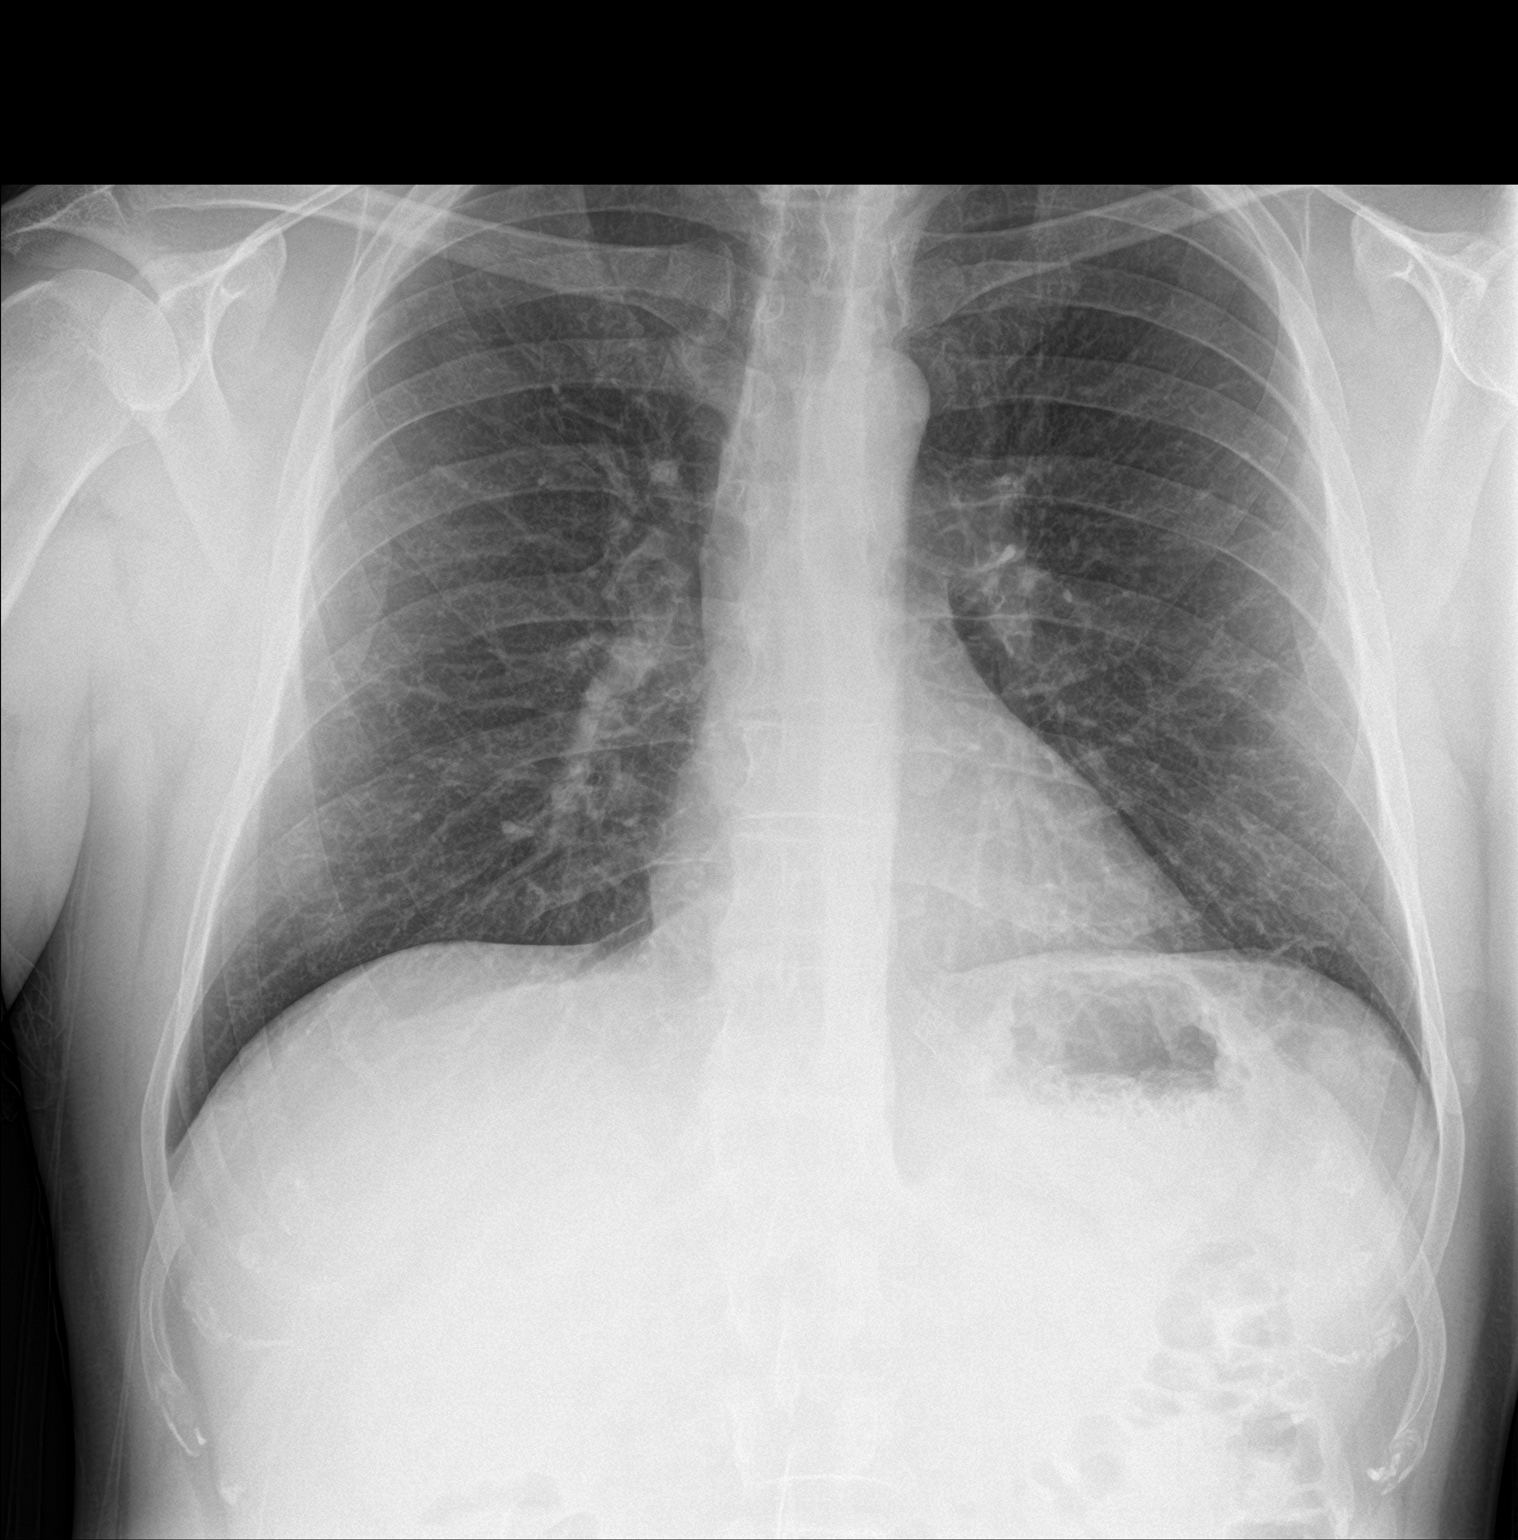

[chest lat]
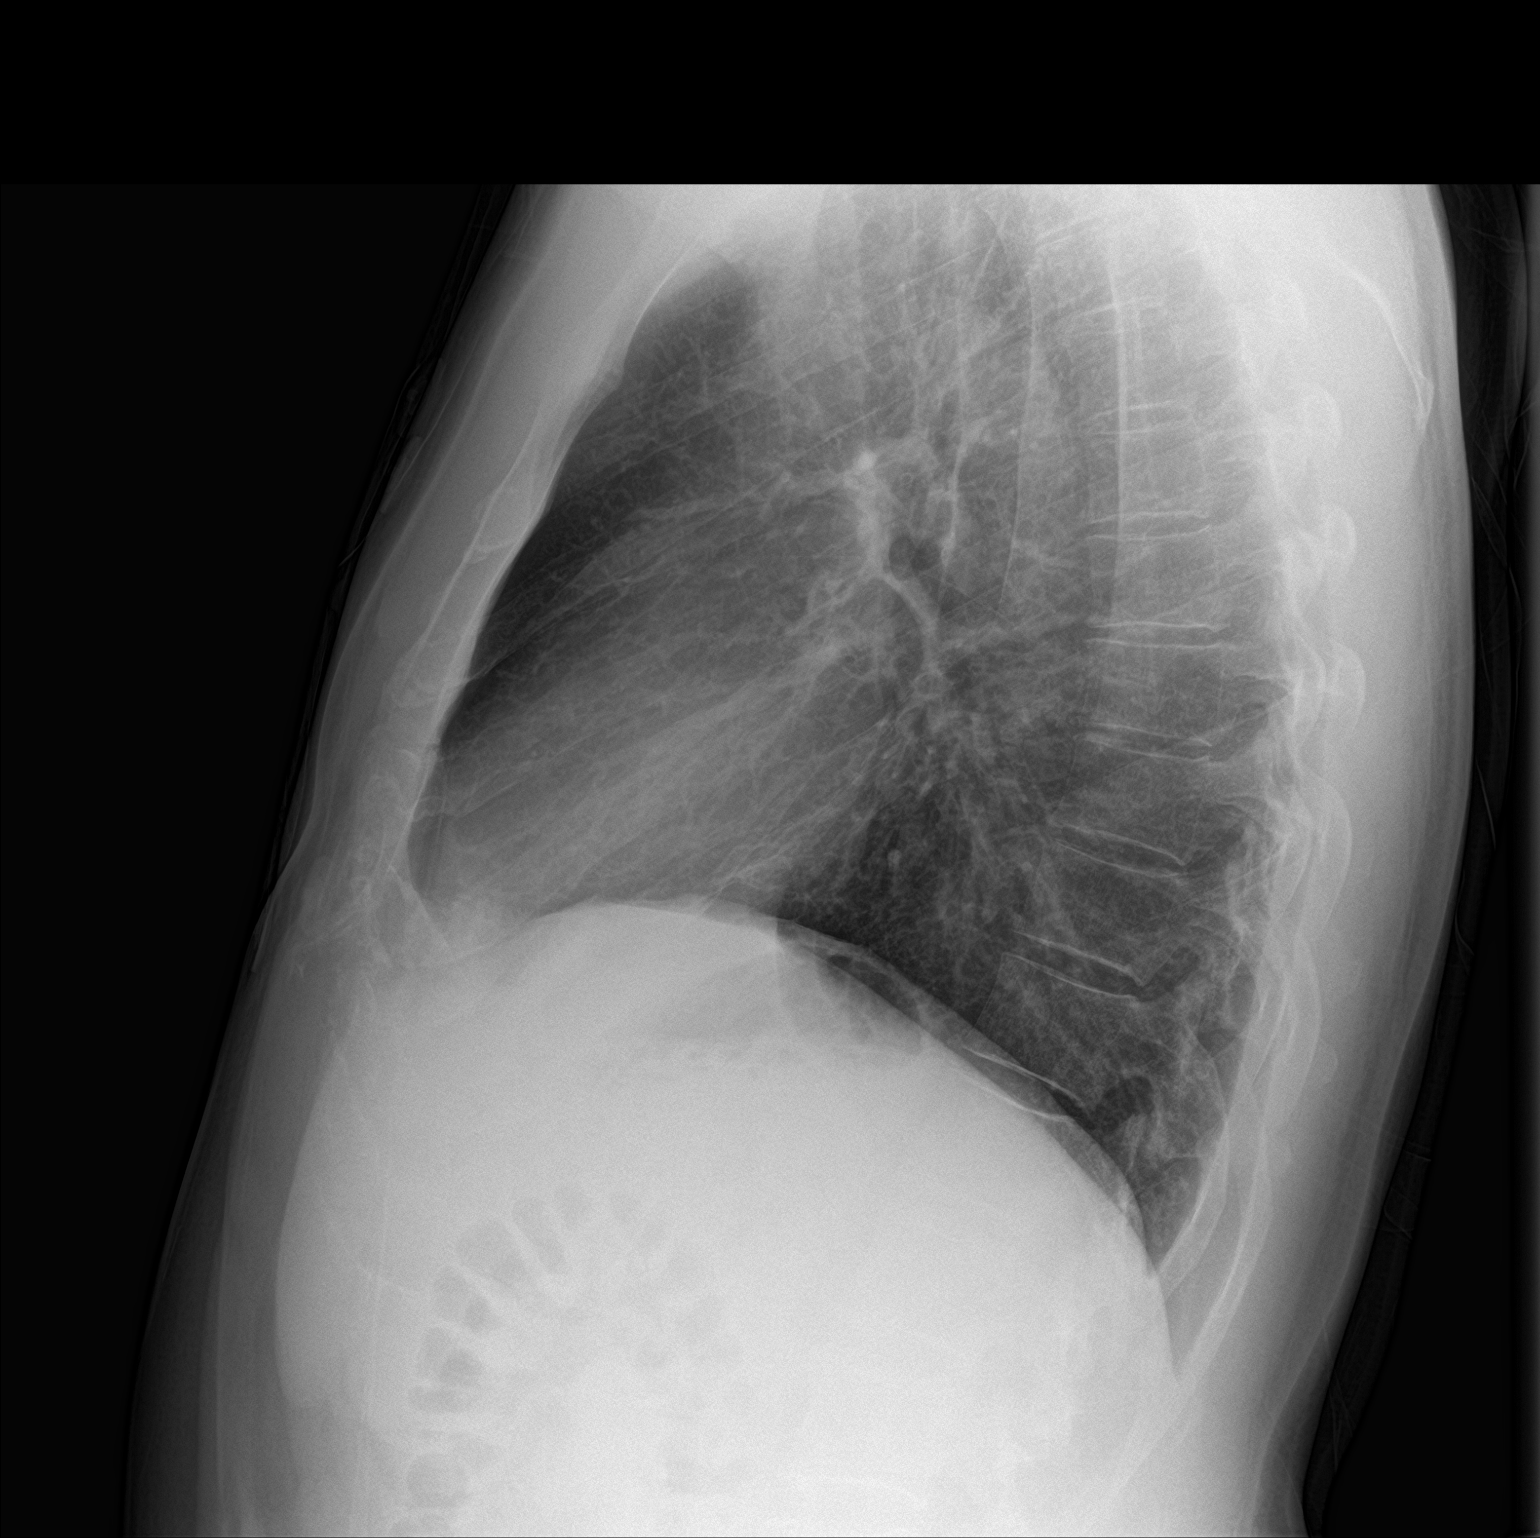

[2 of 2 positions shown; findings below may reference images not displayed]

FINDINGS: The heart size and mediastinal contours are within normal limits.
Both lungs are clear. The visualized skeletal structures are
unremarkable.
IMPRESSION: No acute abnormality of the lungs.
